# Patient Record
Sex: Male | Born: 1975
Health system: Southern US, Community
[De-identification: ages and names within clinical notes are randomized; demographics above are authoritative.]

## PROBLEM LIST (undated history)

## (undated) DIAGNOSIS — K219 Gastro-esophageal reflux disease without esophagitis: Secondary | ICD-10-CM

---

## 2008-07-19 ENCOUNTER — Emergency Department (HOSPITAL_BASED_OUTPATIENT_CLINIC_OR_DEPARTMENT_OTHER): Admission: EM | Admit: 2008-07-19 | Discharge: 2008-07-19 | Payer: Self-pay | Admitting: Emergency Medicine

## 2016-01-03 DIAGNOSIS — Z024 Encounter for examination for driving license: Secondary | ICD-10-CM | POA: Diagnosis not present

## 2017-01-30 DIAGNOSIS — M60252 Foreign body granuloma of soft tissue, not elsewhere classified, left thigh: Secondary | ICD-10-CM | POA: Diagnosis not present

## 2017-05-18 DIAGNOSIS — F17200 Nicotine dependence, unspecified, uncomplicated: Secondary | ICD-10-CM | POA: Diagnosis not present

## 2017-05-18 DIAGNOSIS — Z0000 Encounter for general adult medical examination without abnormal findings: Secondary | ICD-10-CM | POA: Diagnosis not present

## 2017-05-18 DIAGNOSIS — K219 Gastro-esophageal reflux disease without esophagitis: Secondary | ICD-10-CM | POA: Diagnosis not present

## 2017-11-28 ENCOUNTER — Emergency Department (HOSPITAL_COMMUNITY): Payer: BLUE CROSS/BLUE SHIELD

## 2017-11-28 ENCOUNTER — Emergency Department (HOSPITAL_COMMUNITY)
Admission: EM | Admit: 2017-11-28 | Discharge: 2017-11-28 | Disposition: A | Discharge status: Home / Self Care | Payer: BLUE CROSS/BLUE SHIELD | Attending: Emergency Medicine | Admitting: Emergency Medicine

## 2017-11-28 ENCOUNTER — Encounter (HOSPITAL_COMMUNITY): Payer: Self-pay

## 2017-11-28 DIAGNOSIS — R110 Nausea: Secondary | ICD-10-CM | POA: Insufficient documentation

## 2017-11-28 DIAGNOSIS — R109 Unspecified abdominal pain: Secondary | ICD-10-CM | POA: Diagnosis not present

## 2017-11-28 DIAGNOSIS — F17290 Nicotine dependence, other tobacco product, uncomplicated: Secondary | ICD-10-CM | POA: Diagnosis not present

## 2017-11-28 DIAGNOSIS — K420 Umbilical hernia with obstruction, without gangrene: Secondary | ICD-10-CM | POA: Diagnosis not present

## 2017-11-28 DIAGNOSIS — R1033 Periumbilical pain: Secondary | ICD-10-CM | POA: Diagnosis not present

## 2017-11-28 DIAGNOSIS — K429 Umbilical hernia without obstruction or gangrene: Secondary | ICD-10-CM | POA: Diagnosis not present

## 2017-11-28 HISTORY — DX: Gastro-esophageal reflux disease without esophagitis: K21.9

## 2017-11-28 LAB — CBC
HEMATOCRIT: 46.6 % (ref 39.0–52.0)
HEMOGLOBIN: 15.9 g/dL (ref 13.0–17.0)
MCH: 29.9 pg (ref 26.0–34.0)
MCHC: 34.1 g/dL (ref 30.0–36.0)
MCV: 87.6 fL (ref 78.0–100.0)
Platelets: 294 10*3/uL (ref 150–400)
RBC: 5.32 MIL/uL (ref 4.22–5.81)
RDW: 13.5 % (ref 11.5–15.5)
WBC: 8.7 10*3/uL (ref 4.0–10.5)

## 2017-11-28 LAB — COMPREHENSIVE METABOLIC PANEL
ALT: 56 U/L (ref 17–63)
ANION GAP: 10 (ref 5–15)
AST: 40 U/L (ref 15–41)
Albumin: 3.8 g/dL (ref 3.5–5.0)
Alkaline Phosphatase: 82 U/L (ref 38–126)
BUN: 7 mg/dL (ref 6–20)
CO2: 25 mmol/L (ref 22–32)
Calcium: 9.3 mg/dL (ref 8.9–10.3)
Chloride: 102 mmol/L (ref 101–111)
Creatinine, Ser: 0.95 mg/dL (ref 0.61–1.24)
Glucose, Bld: 112 mg/dL — ABNORMAL HIGH (ref 65–99)
POTASSIUM: 4.3 mmol/L (ref 3.5–5.1)
Sodium: 137 mmol/L (ref 135–145)
Total Bilirubin: 0.6 mg/dL (ref 0.3–1.2)
Total Protein: 8 g/dL (ref 6.5–8.1)

## 2017-11-28 LAB — URINALYSIS, ROUTINE W REFLEX MICROSCOPIC
Bilirubin Urine: NEGATIVE
GLUCOSE, UA: NEGATIVE mg/dL
Hgb urine dipstick: NEGATIVE
KETONES UR: NEGATIVE mg/dL
LEUKOCYTES UA: NEGATIVE
NITRITE: NEGATIVE
PH: 8 (ref 5.0–8.0)
Protein, ur: NEGATIVE mg/dL
Specific Gravity, Urine: 1.014 (ref 1.005–1.030)

## 2017-11-28 LAB — LIPASE, BLOOD: LIPASE: 26 U/L (ref 11–51)

## 2017-11-28 LAB — I-STAT CG4 LACTIC ACID, ED: Lactic Acid, Venous: 1.26 mmol/L (ref 0.5–1.9)

## 2017-11-28 MED ORDER — HYDROMORPHONE HCL 1 MG/ML IJ SOLN
1.0000 mg | Freq: Once | INTRAMUSCULAR | Status: AC
  Administered 2017-11-28: 1 mg via INTRAVENOUS
  Filled 2017-11-28: qty 1

## 2017-11-28 MED ORDER — IOPAMIDOL (ISOVUE-300) INJECTION 61%
INTRAVENOUS | Status: AC
  Administered 2017-11-28: 100 mL via INTRAVENOUS
  Filled 2017-11-28: qty 100

## 2017-11-28 MED ORDER — IOPAMIDOL (ISOVUE-300) INJECTION 61%: 100.0000 mL | Freq: Once | INTRAVENOUS | Status: DC | PRN

## 2017-11-28 MED ORDER — ONDANSETRON HCL 4 MG/2ML IJ SOLN
4.0000 mg | Freq: Once | INTRAMUSCULAR | Status: AC
  Administered 2017-11-28: 4 mg via INTRAVENOUS
  Filled 2017-11-28: qty 2

## 2017-11-28 NOTE — ED Triage Notes (Signed)
Seet from u/c this morning for abd pain at umbilicus.  Onset 5:30am woke up with this pain.  Took Gas x with no relief.  This has been 5th episode within 2 months.  This episode is worse. No N/V/D, recent illness.  No one in household sick.

## 2017-11-28 NOTE — ED Provider Notes (Signed)
[facility] EMERGENCY DEPARTMENT Provider Note   CSN: [id number] Arrival date & time: 11/28/17  1056     History   Chief Complaint Chief Complaint  Patient presents with  . Abdominal Pain    HPI Samuel Howard is a 42 y.o. male.  HPI Samuel Howard is a 42 y.o. male presents to emergency department complaining of abdominal pain.  Patient states that he developed severe abdominal pain that woke him up around 530 this morning.  Pain is located around his umbilicus.  He states he has had similar pain several times in the last 2 weeks.  He states in the past the pain would resolve on its own.  He denies any fever or chills.  He states he had a bowel movement this morning.  He reports nausea, no vomiting, but states "I do not ever throw up."  He did not take any medications prior to coming in.  He went to urgent care and was sent here for further evaluation.  Past Medical History:  Diagnosis Date  . Acid reflux     There are no active problems to display for this patient.   History reviewed. No pertinent surgical history.      Home Medications    Prior to Admission medications   Not on File    Family History History reviewed. No pertinent family history.  Social History Social History   Tobacco Use  . Smoking status: Current Every Day Smoker    Types: Cigars  . Smokeless tobacco: Current User    Types: Chew  Substance Use Topics  . Alcohol use: Yes    Comment:  2 beers q day  . Drug use: Never     Allergies   Patient has no known allergies.   Review of Systems Review of Systems  Constitutional: Negative for chills and fever.  Respiratory: Negative for cough, chest tightness and shortness of breath.   Cardiovascular: Negative for chest pain, palpitations and leg swelling.  Gastrointestinal: Positive for abdominal pain and nausea. Negative for abdominal distention, diarrhea and vomiting.  Genitourinary: Negative for dysuria,  frequency, hematuria and urgency.  Musculoskeletal: Negative for arthralgias, myalgias, neck pain and neck stiffness.  Skin: Negative for rash.  Allergic/Immunologic: Negative for immunocompromised state.  Neurological: Negative for dizziness, weakness, light-headedness, numbness and headaches.  All other systems reviewed and are negative.    Physical Exam Updated Vital Signs BP (!) 160/109 (BP Location: Right Arm)   Pulse 80   Temp 98.1 F (36.7 C) (Oral)   Resp 19   Ht 5\' 7"  (1.702 m)   Wt 121.3 kg (267 lb 5 oz)   SpO2 99%   BMI 41.87 kg/m   Physical Exam  Constitutional: He appears well-developed and well-nourished. No distress.  HENT:  Head: Normocephalic and atraumatic.  Eyes: Conjunctivae are normal.  Neck: Neck supple.  Cardiovascular: Normal rate, regular rhythm and normal heart sounds.  Pulmonary/Chest: Effort normal. No respiratory distress. He has no wheezes. He has no rales.  Abdominal: Soft. He exhibits no distension. There is tenderness. There is no rebound.  Tenderness over umbilicus, palpable hernia noted with bowel through the umbilicus.  No discoloration.  Hypoactive bowel sounds present  Musculoskeletal: He exhibits no edema.  Neurological: He is alert.  Skin: Skin is warm and dry.  Nursing note and vitals reviewed.    ED Treatments / Results  Labs (all labs ordered are listed, but only abnormal results are displayed) Labs Reviewed  COMPREHENSIVE  METABOLIC PANEL - Abnormal; Notable for the following components:      Result Value   Glucose, Bld 112 (*)    All other components within normal limits  LIPASE, BLOOD  CBC  URINALYSIS, ROUTINE W REFLEX MICROSCOPIC  I-STAT CG4 LACTIC ACID, ED  I-STAT CG4 LACTIC ACID, ED    EKG None  Radiology Ct Abdomen Pelvis W Contrast  Result Date: 11/28/2017 CLINICAL DATA:  Pt woke with abdominal pain; pt thought it was gas pain, took 3 Gas-X w/o any relief; pt found to have an umbilical hernia in ED, which has  been reduced. EXAM: CT ABDOMEN AND PELVIS WITH CONTRAST TECHNIQUE: Multidetector CT imaging of the abdomen and pelvis was performed using the standard protocol following bolus administration of intravenous contrast. CONTRAST:  <See Chart> ISOVUE-300 IOPAMIDOL (ISOVUE-300) INJECTION 61% COMPARISON:  None. FINDINGS: Lower chest: Clear lung bases.  Heart normal size. Hepatobiliary: Liver is normal size. Liver is of decreased attenuation diffusely consistent with fatty infiltration. No liver mass or focal lesion. Normal gallbladder. No bile duct dilation. Pancreas: Unremarkable. No pancreatic ductal dilatation or surrounding inflammatory changes. Spleen: Normal in size without focal abnormality. Adrenals/Urinary Tract: Adrenal glands are unremarkable. Kidneys are normal, without renal calculi, focal lesion, or hydronephrosis. Bladder is unremarkable. Stomach/Bowel: Mildly prominent loops of small bowel are noted in the right mid to lower abdomen with subtle haziness in the adjacent mesentery. This is adjacent to a small umbilical hernia contains a collection of fluid measuring 3.3 x 1.7 cm. The mildly prominent small bowel loops and associated mesenteric haziness is consistent with the history of recent hernia reduction. No bowel is seen within the small umbilical hernia. There is no evidence of bowel obstruction. Stomach is unremarkable. Colon is within normal limits. Normal appendix is visualized. Vascular/Lymphatic: No significant vascular findings are present. No enlarged abdominal or pelvic lymph nodes. Reproductive: Prostate is mildly enlarged measuring 4.9 cm in greatest transverse dimension. Other: No other hernias.  No ascites. Musculoskeletal: No acute or significant osseous findings. IMPRESSION: 1. Findings are consistent with the recent reduction of small bowel from an umbilical hernia. There is no residual herniated bowel. Mildly prominent loops of small bowel with subtle adjacent mesenteric haziness is  noted adjacent to the hernia. There is no evidence of bowel obstruction. 2. Small amount of fluid lies within the small umbilical hernia. 3. Hepatic steatosis. 4. No other abnormalities. Electronically Signed   By: Amie Portland M.D.   On: 11/28/2017 13:34    Procedures Hernia reduction Date/Time: 11/28/2017 12:16 PM Performed by: Jaynie Crumble, PA-C Authorized by: Jaynie Crumble, PA-C  Consent: Verbal consent obtained. Consent given by: patient Patient understanding: patient states understanding of the procedure being performed Patient consent: the patient's understanding of the procedure matches consent given Procedure consent: procedure consent matches procedure scheduled Required items: required blood products, implants, devices, and special equipment available Patient identity confirmed: verbally with patient and arm band Time out: Immediately prior to procedure a "time out" was called to verify the correct patient, procedure, equipment, support staff and site/side marked as required. Local anesthesia used: no  Anesthesia: Local anesthesia used: no Comments: Successful reduction of ambilical hernia with constant firm pressure in trendelenburg position    (including critical care time)  Medications Ordered in ED Medications  HYDROmorphone (DILAUDID) injection 1 mg (1 mg Intravenous Given 11/28/17 1202)  ondansetron (ZOFRAN) injection 4 mg (4 mg Intravenous Given 11/28/17 1202)  iopamidol (ISOVUE-300) 61 % injection (100 mLs Intravenous Contrast Given 11/28/17 1301)  Initial Impression / Assessment and Plan / ED Course  I have reviewed the triage vital signs and the nursing notes.  Pertinent labs & imaging results that were available during my care of the patient were reviewed by me and considered in my medical decision making (see chart for details).     Patient presented to emergency department with umbilical pain.  History of intermittent similar pain in the past,  normally resolves on its own.  At this time pain is constant since early this morning.  Exam consistent with incarcerated umbilical hernia.  Applied ice pack, will give pain medication, and try to reduce the hernia.  12:18 PM After receiving 1 mg of Dilaudid, patient was placed supine in Trendelenburg position, with ice pack on umbilicus.  Firm pressure was applied to the hernia site, with successful reduction.  Patient tolerated procedure well.  Labs and CT abdomen pelvis still pending.  1:48 PM CT consistent with recently reduced hernia. No other significant findings. Pt feels better. Plan to dc home. Discussed no straining or heavy lifting. Follow up with general surgery. Discussed symptoms and signs that should prompt his return.   Vitals:   11/28/17 1104  BP: (!) 160/109  Pulse: 80  Resp: 19  Temp: 98.1 F (36.7 C)  TempSrc: Oral  SpO2: 99%  Weight: 121.3 kg (267 lb 5 oz)  Height: 5\' 7"  (1.702 m)     Final Clinical Impressions(s) / ED Diagnoses   Final diagnoses:  Incarcerated umbilical hernia    ED Discharge Orders    None       Jaynie CrumbleKirichenko, Bettyjane Shenoy, PA-C 11/28/17 1512    Margarita Grizzleay, Danielle, MD 11/29/17 1733

## 2017-11-28 NOTE — ED Notes (Signed)
Patient transported to CT 

## 2017-11-28 NOTE — Discharge Instructions (Signed)
Avoid strenuous activity. Follow up with general surgery. Return if worsening symptoms.

## 2017-12-10 DIAGNOSIS — K429 Umbilical hernia without obstruction or gangrene: Secondary | ICD-10-CM | POA: Diagnosis not present

## 2019-03-20 IMAGING — CT CT ABD-PELV W/ CM
2 of 5 series · 16 of 46 positions shown, 18 images · IV contrast (APPLIED)
Comparison: None.

CLINICAL DATA: Pt woke with abdominal pain; pt thought it was gas
pain, took 3 Gas-X w/o any relief; pt found to have an umbilical
hernia in ED, which has been reduced.

EXAM:
CT ABDOMEN AND PELVIS WITH CONTRAST
TECHNIQUE: Multidetector CT imaging of the abdomen and pelvis was performed
using the standard protocol following bolus administration of
intravenous contrast.
CONTRAST:  <See Chart> KHZAWP-TDD IOPAMIDOL (KHZAWP-TDD) INJECTION
61%

[Series 3: abd/ pelvis 5.0 i30f 2 · axial · 0.98mm/px · z∈[-479,-39]mm · 13 of 100 slices shown, 15 images]
[im 6/100  soft-tissue]
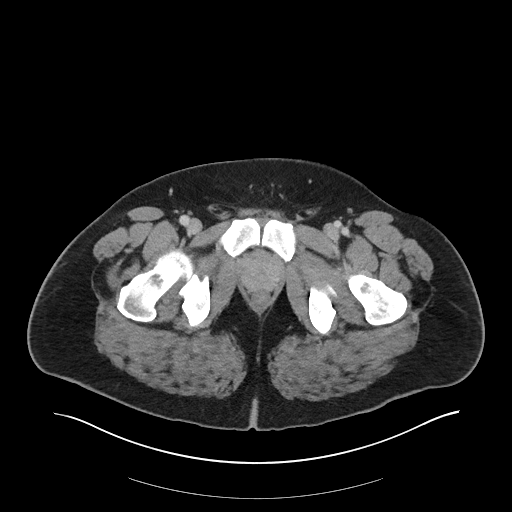
[im 6/100  bone]
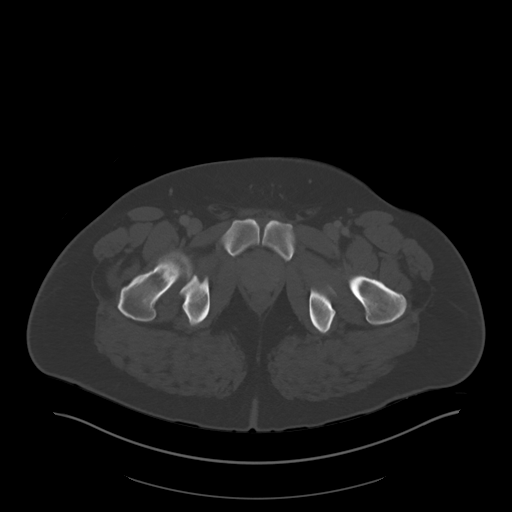
[im 16/100  soft-tissue]
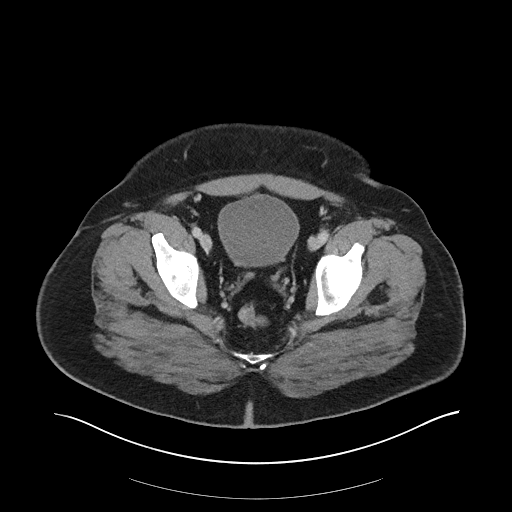
[im 21/100  soft-tissue]
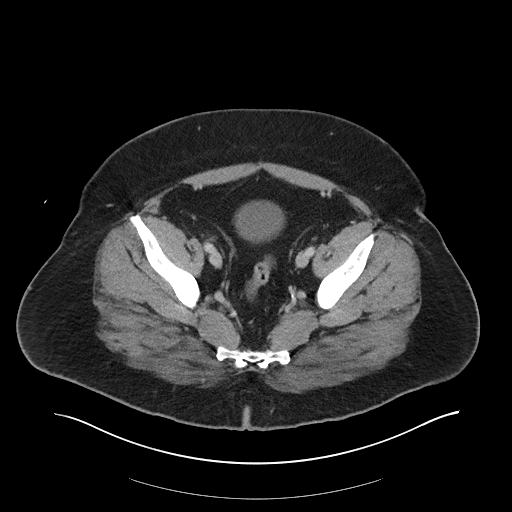
[im 27/100  soft-tissue]
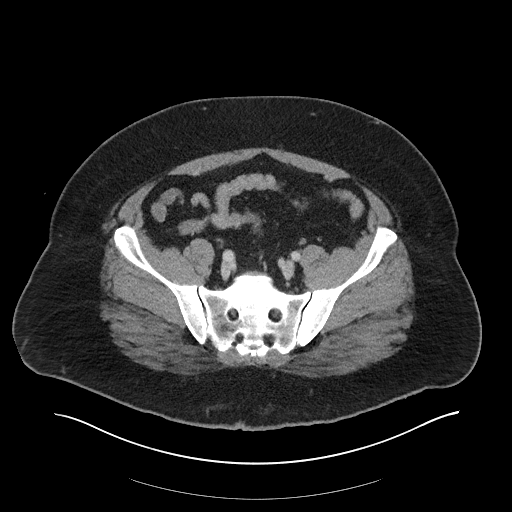
[im 37/100  soft-tissue]
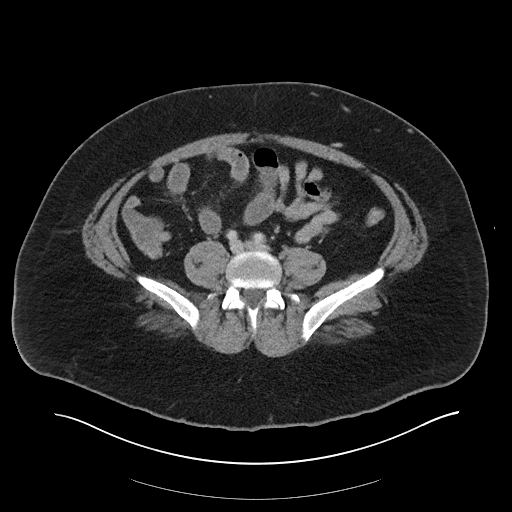
[im 42/100  soft-tissue]
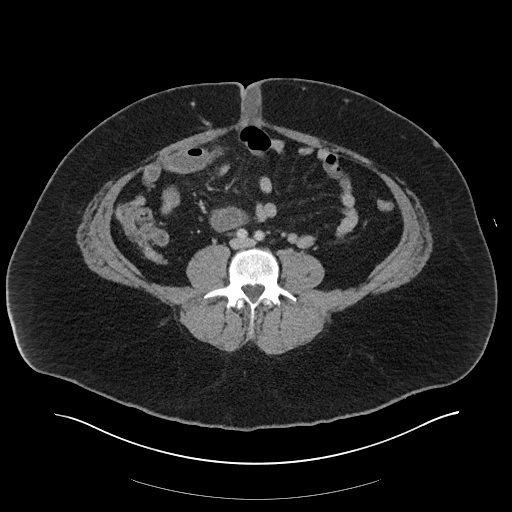
[im 53/100  soft-tissue]
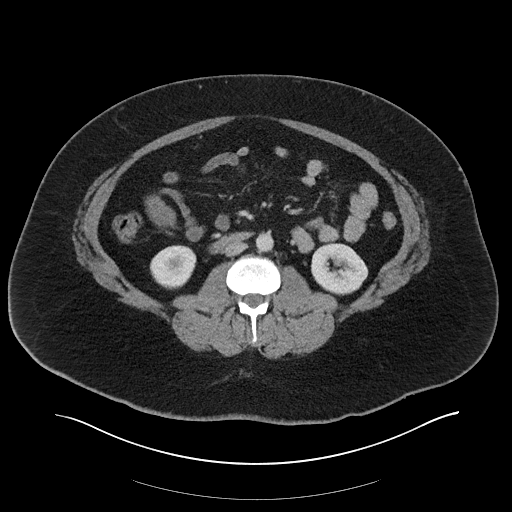
[im 58/100  soft-tissue]
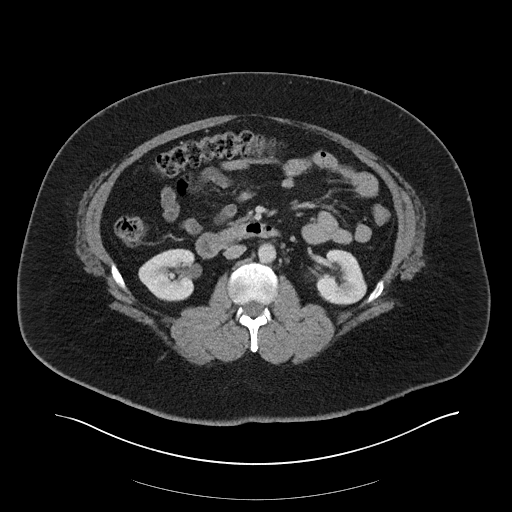
[im 63/100  soft-tissue]
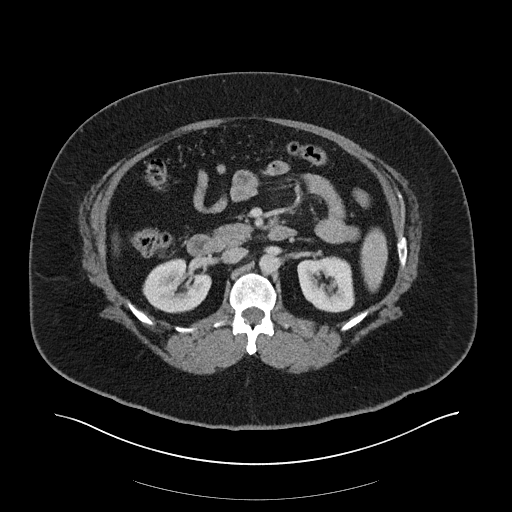
[im 63/100  bone]
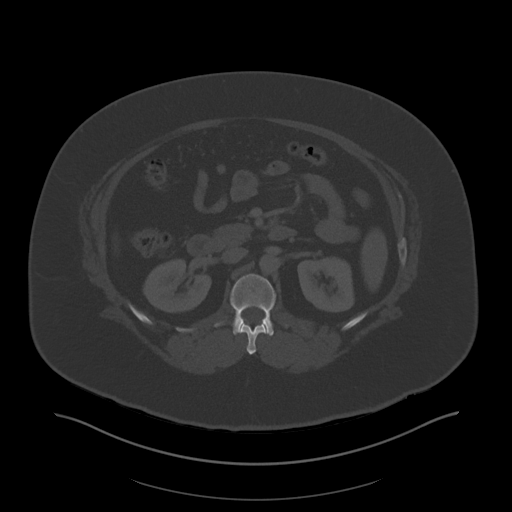
[im 73/100  soft-tissue]
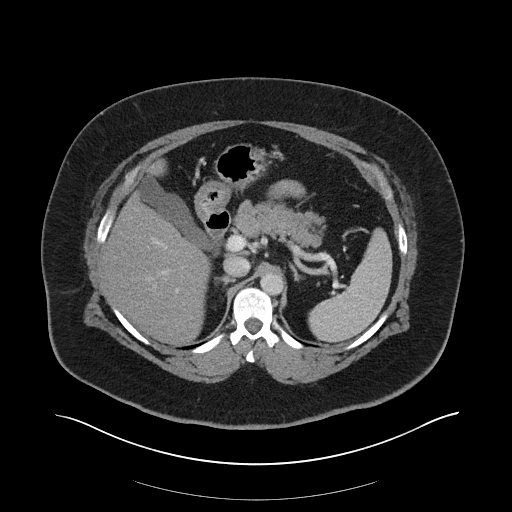
[im 79/100  soft-tissue]
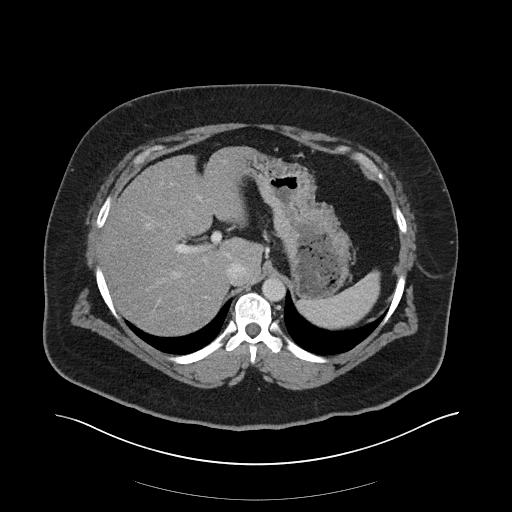
[im 84/100  soft-tissue]
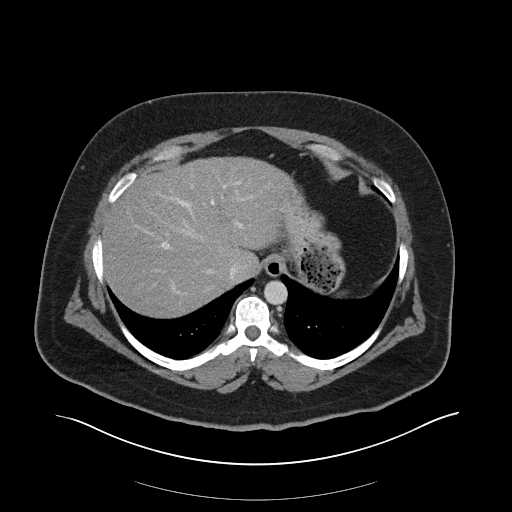
[im 94/100  soft-tissue]
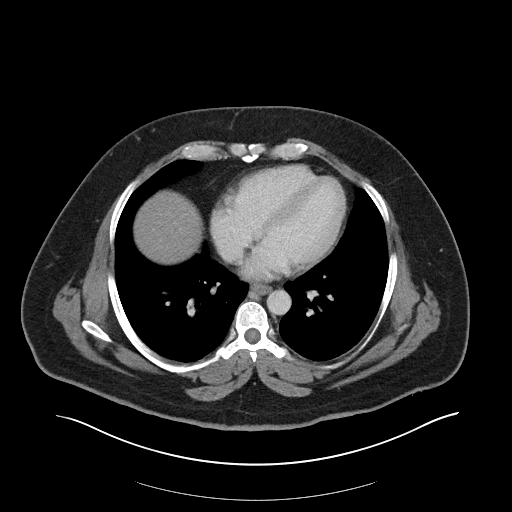

[Series 7: coronal soft tissue · coronal · 0.98mm/px · 3 of 101 slices shown]
[im 34/101  soft-tissue]
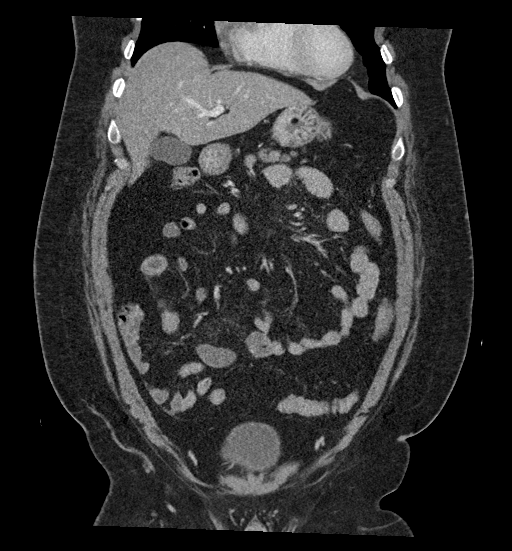
[im 45/101  soft-tissue]
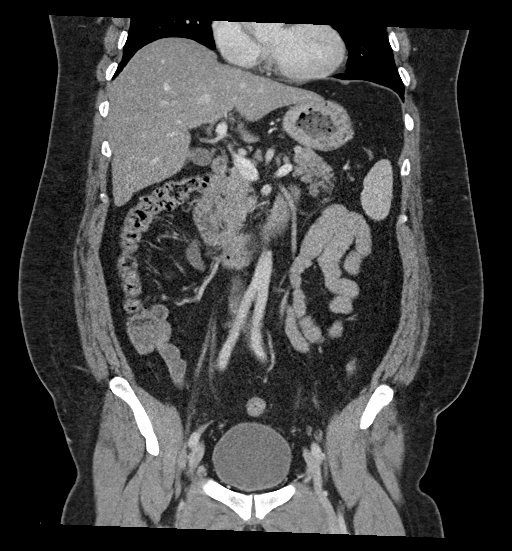
[im 56/101  soft-tissue]
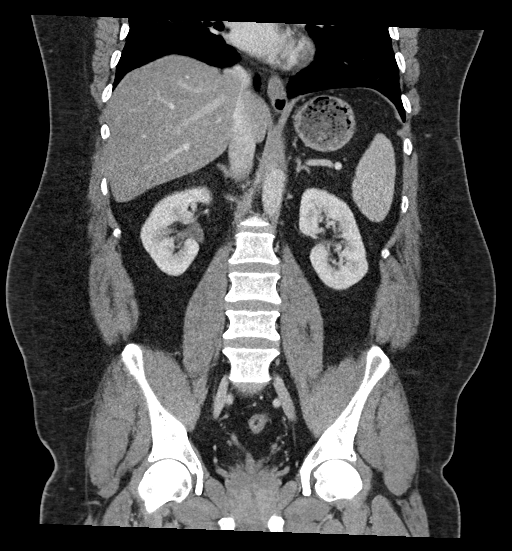

[16 of 46 positions shown; findings below may reference images not displayed]

FINDINGS: Lower chest: Clear lung bases.  Heart normal size.

Hepatobiliary: Liver is normal size. Liver is of decreased
attenuation diffusely consistent with fatty infiltration. No liver
mass or focal lesion. Normal gallbladder. No bile duct dilation.

Pancreas: Unremarkable. No pancreatic ductal dilatation or
surrounding inflammatory changes.

Spleen: Normal in size without focal abnormality.

Adrenals/Urinary Tract: Adrenal glands are unremarkable. Kidneys are
normal, without renal calculi, focal lesion, or hydronephrosis.
Bladder is unremarkable.

Stomach/Bowel: Mildly prominent loops of small bowel are noted in
the right mid to lower abdomen with subtle haziness in the adjacent
mesentery. This is adjacent to a small umbilical hernia contains a
collection of fluid measuring 3.3 x 1.7 cm. The mildly prominent
small bowel loops and associated mesenteric haziness is consistent
with the history of recent hernia reduction. No bowel is seen within
the small umbilical hernia. There is no evidence of bowel
obstruction. Stomach is unremarkable. Colon is within normal limits.
Normal appendix is visualized.

Vascular/Lymphatic: No significant vascular findings are present. No
enlarged abdominal or pelvic lymph nodes.

Reproductive: Prostate is mildly enlarged measuring 4.9 cm in
greatest transverse dimension.

Other: No other hernias.  No ascites.

Musculoskeletal: No acute or significant osseous findings.
IMPRESSION: 1. Findings are consistent with the recent reduction of small bowel
from an umbilical hernia. There is no residual herniated bowel.
Mildly prominent loops of small bowel with subtle adjacent
mesenteric haziness is noted adjacent to the hernia. There is no
evidence of bowel obstruction.
2. Small amount of fluid lies within the small umbilical hernia.
3. Hepatic steatosis.
4. No other abnormalities.
